# Patient Record
Sex: Female | Born: 1995 | Race: White | Hispanic: No | Marital: Single | Smoking: Never smoker
Health system: Southern US, Community
[De-identification: ages and names within clinical notes are randomized; demographics above are authoritative.]

---

## 2016-01-28 ENCOUNTER — Emergency Department (HOSPITAL_COMMUNITY): Payer: BLUE CROSS/BLUE SHIELD

## 2016-01-28 ENCOUNTER — Emergency Department (HOSPITAL_COMMUNITY)
Admission: EM | Admit: 2016-01-28 | Discharge: 2016-01-28 | Disposition: A | Discharge status: Home / Self Care | Payer: BLUE CROSS/BLUE SHIELD | Attending: Emergency Medicine | Admitting: Emergency Medicine

## 2016-01-28 ENCOUNTER — Encounter (HOSPITAL_COMMUNITY): Payer: Self-pay | Admitting: Emergency Medicine

## 2016-01-28 DIAGNOSIS — Y9289 Other specified places as the place of occurrence of the external cause: Secondary | ICD-10-CM | POA: Insufficient documentation

## 2016-01-28 DIAGNOSIS — S8991XA Unspecified injury of right lower leg, initial encounter: Secondary | ICD-10-CM | POA: Insufficient documentation

## 2016-01-28 DIAGNOSIS — Y998 Other external cause status: Secondary | ICD-10-CM | POA: Insufficient documentation

## 2016-01-28 DIAGNOSIS — W08XXXA Fall from other furniture, initial encounter: Secondary | ICD-10-CM | POA: Diagnosis not present

## 2016-01-28 DIAGNOSIS — M25561 Pain in right knee: Secondary | ICD-10-CM

## 2016-01-28 DIAGNOSIS — Y9389 Activity, other specified: Secondary | ICD-10-CM | POA: Insufficient documentation

## 2016-01-28 MED ORDER — NAPROXEN 250 MG PO TABS: 250.0000 mg | ORAL_TABLET | Freq: Two times a day (BID) | ORAL | Status: AC

## 2016-01-28 MED ORDER — ACETAMINOPHEN 325 MG PO TABS
650.0000 mg | ORAL_TABLET | Freq: Once | ORAL | Status: AC
  Administered 2016-01-28: 650 mg via ORAL
  Filled 2016-01-28: qty 2

## 2016-01-28 NOTE — ED Notes (Signed)
Pt fell off the couch as her "leg gave out and I heard a pop".  Right knee began to swell and it feels, "locked now".

## 2016-01-28 NOTE — ED Notes (Signed)
Bed: WA27 Expected date:  Expected time:  Means of arrival:  Comments: 

## 2016-01-28 NOTE — ED Provider Notes (Signed)
CSN: [id number]     Arrival date & time 01/28/16  1138 History  By signing my name below, I, Soijett Blue, attest that this documentation has been prepared under the direction and in the presence of Will Zakira Ressel, PA-C Electronically Signed: Soijett Blue, ED Scribe. 01/28/2016. 12:19 PM.   Chief Complaint  Patient presents with  . Knee Injury      The history is provided by the patient. No language interpreter was used.    Molly Arias is a 20 y.o. female who presents to the Emergency Department via EMS complaining of right knee injury occurring last night. Pt notes that she rotated off of a couch and she heard a "snap" with progressive swelling to the right knee since. Pt rates her right knee pain as 8/10 at this time. Denies prior injury to the area. Pt is having associated symptoms of gait problem due to pain and right knee swelling. She notes that she has tried advil with her last dose at 10 AM, rest, ice, with no relief of her symptoms. She denies color change, wound, rash, numbness, tingling, weakness, and any other symptoms.    History reviewed. No pertinent past medical history. History reviewed. No pertinent past surgical history. History reviewed. No pertinent family history. Social History  Substance Use Topics  . Smoking status: Never Smoker   . Smokeless tobacco: None  . Alcohol Use: None   OB History    No data available     Review of Systems  Constitutional: Negative for fever.  Cardiovascular: Negative for leg swelling.  Musculoskeletal: Positive for joint swelling, arthralgias and gait problem (due to pain).  Skin: Negative for color change, rash and wound.  Neurological: Negative for weakness and numbness.       No tingling      Allergies  Review of patient's allergies indicates no known allergies.  Home Medications   Prior to Admission medications   Medication Sig Start Date End Date Taking? Authorizing Provider  naproxen (NAPROSYN) 250 MG tablet  Take 1 tablet (250 mg total) by mouth 2 (two) times daily with a meal. 01/28/16   Everlene FarrierWilliam Kesa Birky, PA-C   BP 148/101 mmHg  Pulse 105  Temp(Src) 98.3 F (36.8 C) (Oral)  Resp 16  SpO2 100%  LMP 01/21/2016 Physical Exam  Constitutional: She appears well-developed and well-nourished. No distress.  Nontoxic appearing.  HENT:  Head: Normocephalic and atraumatic.  Eyes: Right eye exhibits no discharge. Left eye exhibits no discharge.  Cardiovascular: Normal rate, regular rhythm and intact distal pulses.   Bilateral posterior tibialis and dorsalis pedis pulses are intact.  Good capillary refill to her right distal toes.  Pulmonary/Chest: Effort normal. No respiratory distress.  Musculoskeletal: Normal range of motion. She exhibits edema and tenderness.  Right knee: Bilateral dp and pt pulses intact. No calf tenderness. Medial edema and tenderness to the medial aspect. Good right knee ROM. Able to ambulate with antalgic gait. No knee instability. No ecchymosis. No deformity.   Neurological: She is alert. Coordination normal.  Sensation is intact in her bilateral lower extremities.  Skin: Skin is warm and dry. No rash noted. She is not diaphoretic. No erythema. No pallor.  Psychiatric: She has a normal mood and affect. Her behavior is normal.  Nursing note and vitals reviewed.   ED Course  Procedures (including critical care time) DIAGNOSTIC STUDIES: Oxygen Saturation is 100% on RA, nl by my interpretation.    COORDINATION OF CARE: 12:18 PM Discussed treatment plan with pt  at bedside which includes right knee xray, tylenol, and ice and pt agreed to plan.    Labs Review Labs Reviewed - No data to display  Imaging Review Dg Knee Complete 4 Views Right  01/28/2016  CLINICAL DATA:  Injury EXAM: RIGHT KNEE - COMPLETE 4+ VIEW COMPARISON:  None. FINDINGS: Four views of the right knee submitted. No displaced fracture or subluxation. There is a tiny linear calcification adjacent to medial  femoral condyle. Previous injury of medial collateral ligament cannot be excluded. Clinical correlation is necessary. Further correlation with MRI could be performed as clinically warranted. Small joint effusion. IMPRESSION: No displaced fracture or subluxation. There is a tiny linear calcification adjacent to medial femoral condyle. Previous injury of medial collateral ligament cannot be excluded. Clinical correlation is necessary. Further correlation with MRI could be performed as clinically warranted. Small joint effusion. Electronically Signed   By: Natasha Mead M.D.   On: 01/28/2016 13:21   I have personally reviewed and evaluated these images as part of my medical decision-making.   EKG Interpretation None     Filed Vitals:   01/28/16 1144  BP: 148/101  Pulse: 105  Temp: 98.3 F (36.8 C)  TempSrc: Oral  Resp: 16  SpO2: 100%    MDM   Meds given in ED:  Medications  acetaminophen (TYLENOL) tablet 650 mg (650 mg Oral Given 01/28/16 1216)    New Prescriptions   NAPROXEN (NAPROSYN) 250 MG TABLET    Take 1 tablet (250 mg total) by mouth 2 (two) times daily with a meal.    Final diagnoses:  Right knee pain   This is a 20 y.o. female who presents to the Emergency Department via EMS complaining of right knee injury occurring last night. Pt notes that she rotated off of a couch and she heard a "snap" with progressive swelling to the right knee since. On exam the patient is afebrile and nontoxic appearing. She does have some mild medial right knee edema with tenderness to palpation. No knee instability. No deformity. No ecchymosis or erythema. Good range of motion of her right knee. She is ambulating with antalgic gait to the room. Right knee x-ray shows no displaced fracture or subluxation. It does indicate a tiny linear calcification adjacent to the medial femoral condyle which could be an indication of a previous injury to the MCL. I discussed these findings with the patient. Will provide  with knee sleeve and crutches and have her nonweightbearing until she can follow-up with orthopedic surgeon Dr. Roda Shutters. I advised she may require further imaging such as MRI after evaluation by orthopedic surgery. I advised the patient to follow-up with their primary care provider this week. I advised the patient to return to the emergency department with new or worsening symptoms or new concerns. The patient verbalized understanding and agreement with plan.    I personally performed the services described in this documentation, which was scribed in my presence. The recorded information has been reviewed and is accurate.       Everlene Farrier, PA-C 01/28/16 1338  Lyndal Pulley, MD 01/30/16 [phone]

## 2016-01-28 NOTE — Discharge Instructions (Signed)
°How to Use a Knee Brace °A knee brace is a device that you wear to support your knee, especially if the knee is healing after an injury or surgery. There are several types of knee braces. Some are designed to prevent an injury (prophylactic brace). These are often worn during sports. Others support an injured knee (functional brace) or keep it still while it heals (rehabilitative brace). People with severe arthritis of the knee may benefit from a brace that takes some pressure off the knee (unloader brace). Most knee braces are made from a combination of cloth and metal or plastic.  °You may need to wear a knee brace to: °· Relieve knee pain. °· Help your knee support your weight (improve stability). °· Help you walk farther (improve mobility). °· Prevent injury. °· Support your knee while it heals from surgery or from an injury. °RISKS AND COMPLICATIONS °Generally, knee braces are very safe to wear. However, problems may occur, including: °1. Skin irritation that may lead to infection. °2. Making your condition worse if you wear the brace in the wrong way. °HOW TO USE A KNEE BRACE °Different braces will have different instructions for use. Your health care provider will tell you or show you: °1. How to put on your brace. °2. How to adjust the brace. °3. When and how often to wear the brace. °4. How to remove the brace. °5. If you will need any assistive devices in addition to the brace, such as crutches or a cane. °In general, your brace should: °1. Have the hinge of the brace line up with the bend of your knee. °2. Have straps, hooks, or tapes that fasten snugly around your leg. °3. Not feel too tight or too loose.  °HOW TO CARE FOR A KNEE BRACE °1. Check your brace often for signs of damage, such as loose connections or attachments. Your knee brace may get damaged or wear out during normal use. °2. Wash the fabric parts of your brace with soap and water. °3. Read the insert that comes with your brace for other  specific care instructions.  °SEEK MEDICAL CARE IF: °1. Your knee brace is too loose or too tight and you cannot adjust it. °2. Your knee brace causes skin redness, swelling, bruising, or irritation. °3. Your knee brace is not helping. °4. Your knee brace is making your knee pain worse. °  °This information is not intended to replace advice given to you by your health care provider. Make sure you discuss any questions you have with your health care provider. °  °Document Released: 01/11/2004 Document Revised: 07/12/2015 Document Reviewed: 02/13/2015 °Elsevier Interactive Patient Education ©2016 [employer] °Knee Pain °Knee pain is a very common symptom and can have many causes. Knee pain often goes away when you follow your health care provider's instructions for relieving pain and discomfort at home. However, knee pain can develop into a condition that needs treatment. Some conditions may include: °· Arthritis caused by wear and tear (osteoarthritis). °· Arthritis caused by swelling and irritation (rheumatoid arthritis or gout). °· A cyst or growth in your knee. °· An infection in your knee joint. °· An injury that will not heal. °· Damage, swelling, or irritation of the tissues that support your knee (torn ligaments or tendinitis). °If your knee pain continues, additional tests may be ordered to diagnose your condition. Tests may include X-rays or other imaging studies of your knee. You may also need to have fluid removed from your knee. Treatment for ongoing   knee pain depends on the cause, but treatment may include: 3. Medicines to relieve pain or swelling. 4. Steroid injections in your knee. 5. Physical therapy. 6. Surgery. HOME CARE INSTRUCTIONS 6. Take medicines only as directed by your health care provider. 7. Rest your knee and keep it raised (elevated) while you are resting. 8. Do not do things that cause or worsen pain. 9. Avoid high-impact activities or exercises, such as running, jumping rope,  or doing jumping jacks. 10. Apply ice to the knee area: 1. Put ice in a plastic bag. 2. Place a towel between your skin and the bag. 3. Leave the ice on for 20 minutes, 2-3 times a day. 11. Ask your health care provider if you should wear an elastic knee support. 12. Keep a pillow under your knee when you sleep. 13. Lose weight if you are overweight. Extra weight can put pressure on your knee. 14. Do not use any tobacco products, including cigarettes, chewing tobacco, or electronic cigarettes. If you need help quitting, ask your health care provider. Smoking may slow the healing of any bone and joint problems that you may have. SEEK MEDICAL CARE IF: 4. Your knee pain continues, changes, or gets worse. 5. You have a fever along with knee pain. 6. Your knee buckles or locks up. 7. Your knee becomes more swollen. SEEK IMMEDIATE MEDICAL CARE IF:  4. Your knee joint feels hot to the touch. 5. You have chest pain or trouble breathing.   This information is not intended to replace advice given to you by your health care provider. Make sure you discuss any questions you have with your health care provider.   Document Released: 08/18/2007 Document Revised: 11/11/2014 Document Reviewed: 06/06/2014 Elsevier Interactive Patient Education 2016 [employer] Crutch Use Crutches are used to take weight off one of your legs or feet when you stand or walk. It is important to use crutches that fit properly. When fitted properly:  Each crutch should be 2-3 finger widths below the armpit.  Your weight should be supported by your hand, and not by resting the armpit on the crutch. RISKS AND COMPLICATIONS Damage to the nerves that extend from your armpit to your hand and arm. To prevent this from happening, make sure your crutches fit properly and do not put pressure on your armpit when using them. HOW TO USE YOUR CRUTCHES If you have been instructed to use partial weight bearing, apply (bear) the amount of  weight as your health care provider suggests. Do not bear weight in an amount that causes pain to the area of injury. Walking 7. Step with the crutches. 8. Swing the healthy leg slightly ahead of the crutches. Going Up Steps If there is no handrail: 15. Step up with the healthy leg. 16. Step up with the crutches and injured leg. 17. Continue in this way. If there is a handrail: 8. Hold both crutches in one hand. 9. Place your free hand on the handrail. 10. While putting your weight on your arms, lift your healthy leg to the step. 11. Bring the crutches and the injured leg up to that step. 12. Continue in this way. Going Down Steps Be very careful, as going down stairs with crutches is very challenging. If there is no handrail: 6. Step down with the injured leg and crutches. 7. Step down with the healthy leg. If there is a handrail: 5. Place your hand on the handrail. 6. Hold both crutches with your free hand. 7. Lower  your injured leg and crutch to the step below you. Make sure to keep the crutch tips in the center of the step, never on the edge. 8. Lower your healthy leg to that step. 9. Continue in this way. Standing Up 1. Hold the injured leg forward. 2. Grab the armrest with one hand and the top of the crutches with the other hand. 3. Using these supports, pull yourself up to a standing position. Sitting Down 1. Hold the injured leg forward. 2. Grab the armrest with one hand and the top of the crutches with the other hand. 3. Lower yourself to a sitting position. SEEK MEDICAL CARE IF:  You still feel unsteady on your feet.  You develop new pain, for example in your armpits, back, shoulder, wrist, or hip.  You develop any numbness or tingling. SEEK IMMEDIATE MEDICAL CARE IF:  You fall.   This information is not intended to replace advice given to you by your health care provider. Make sure you discuss any questions you have with your health care provider.   Document  Released: 10/18/2000 Document Revised: 11/11/2014 Document Reviewed: 06/28/2013 Elsevier Interactive Patient Education [employer]

## 2017-09-11 IMAGING — DX DG KNEE COMPLETE 4+V*R*
4 series · 4 of 4 positions shown · non-contrast
Comparison: None.

CLINICAL DATA: Injury

EXAM:
RIGHT KNEE - COMPLETE 4+ VIEW

[knee ap]
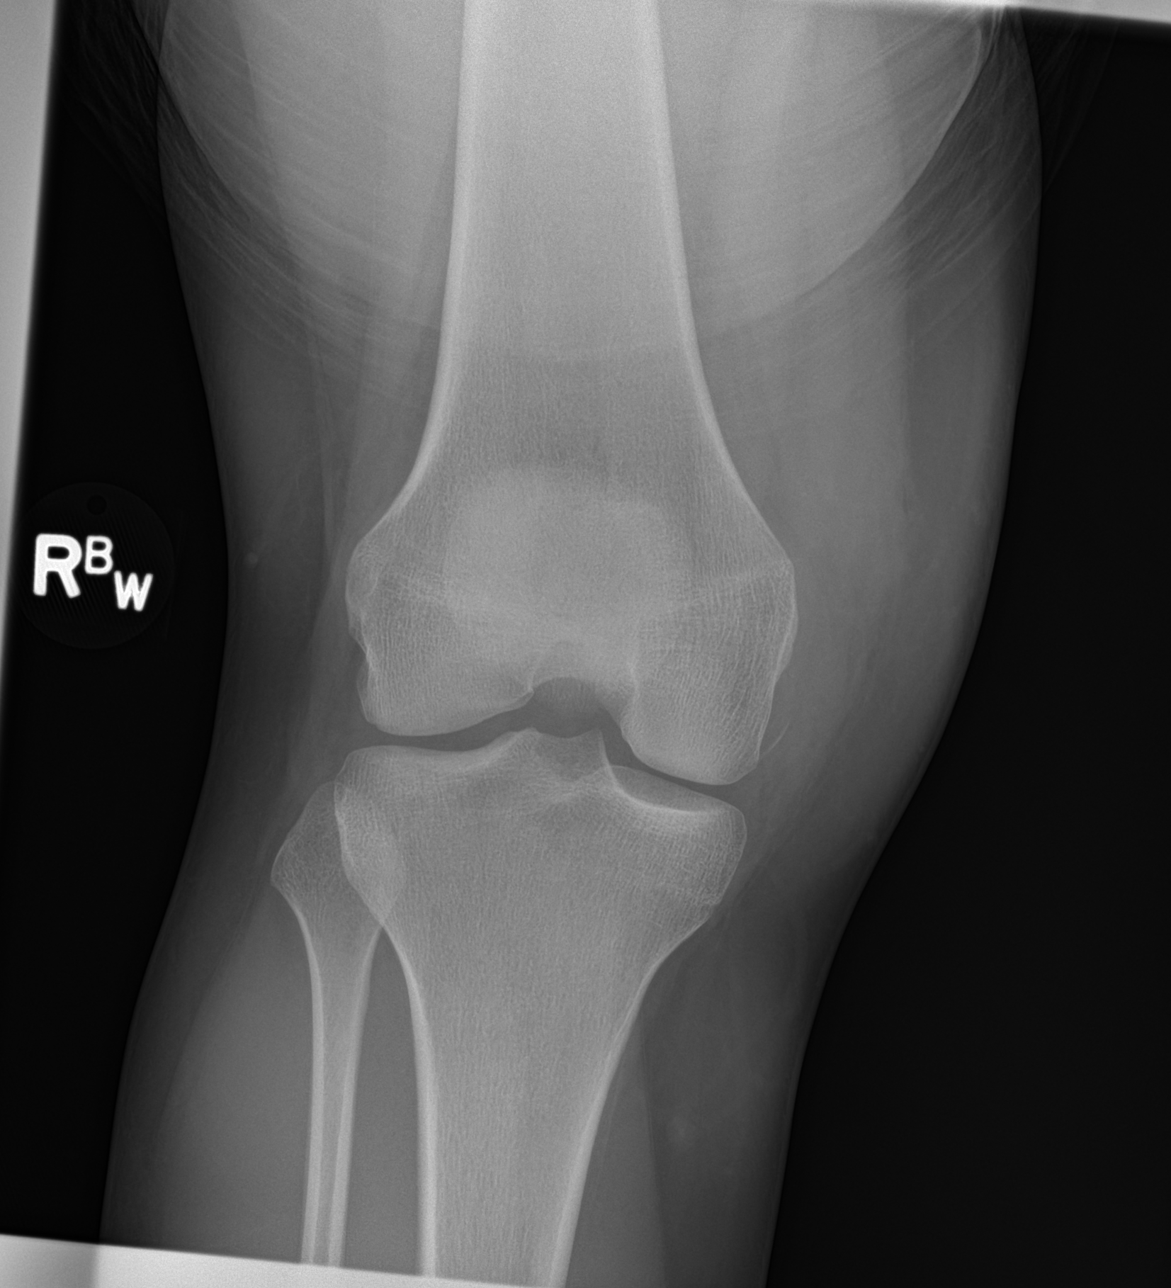

[knee lat]
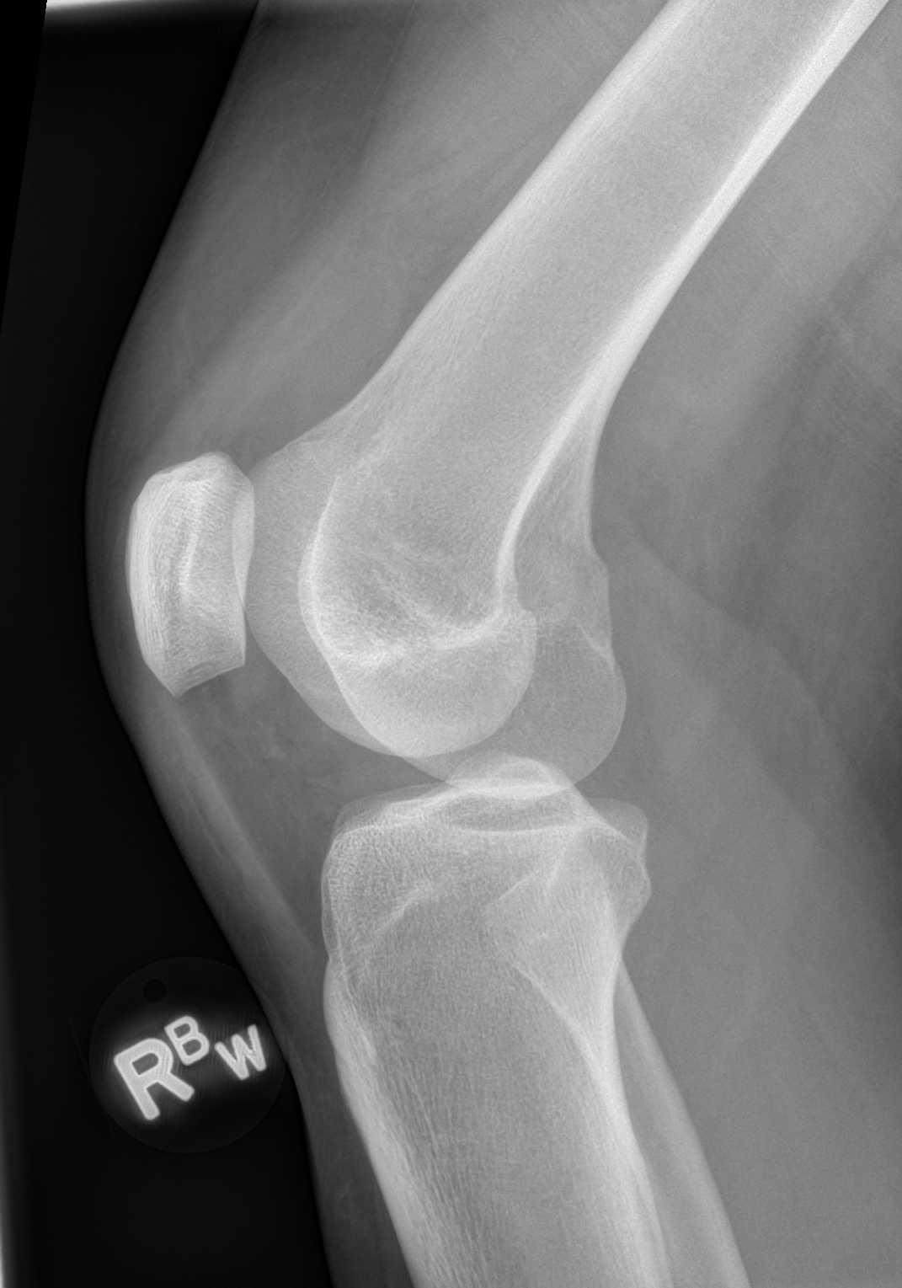

[knee obl (1 of 2)]
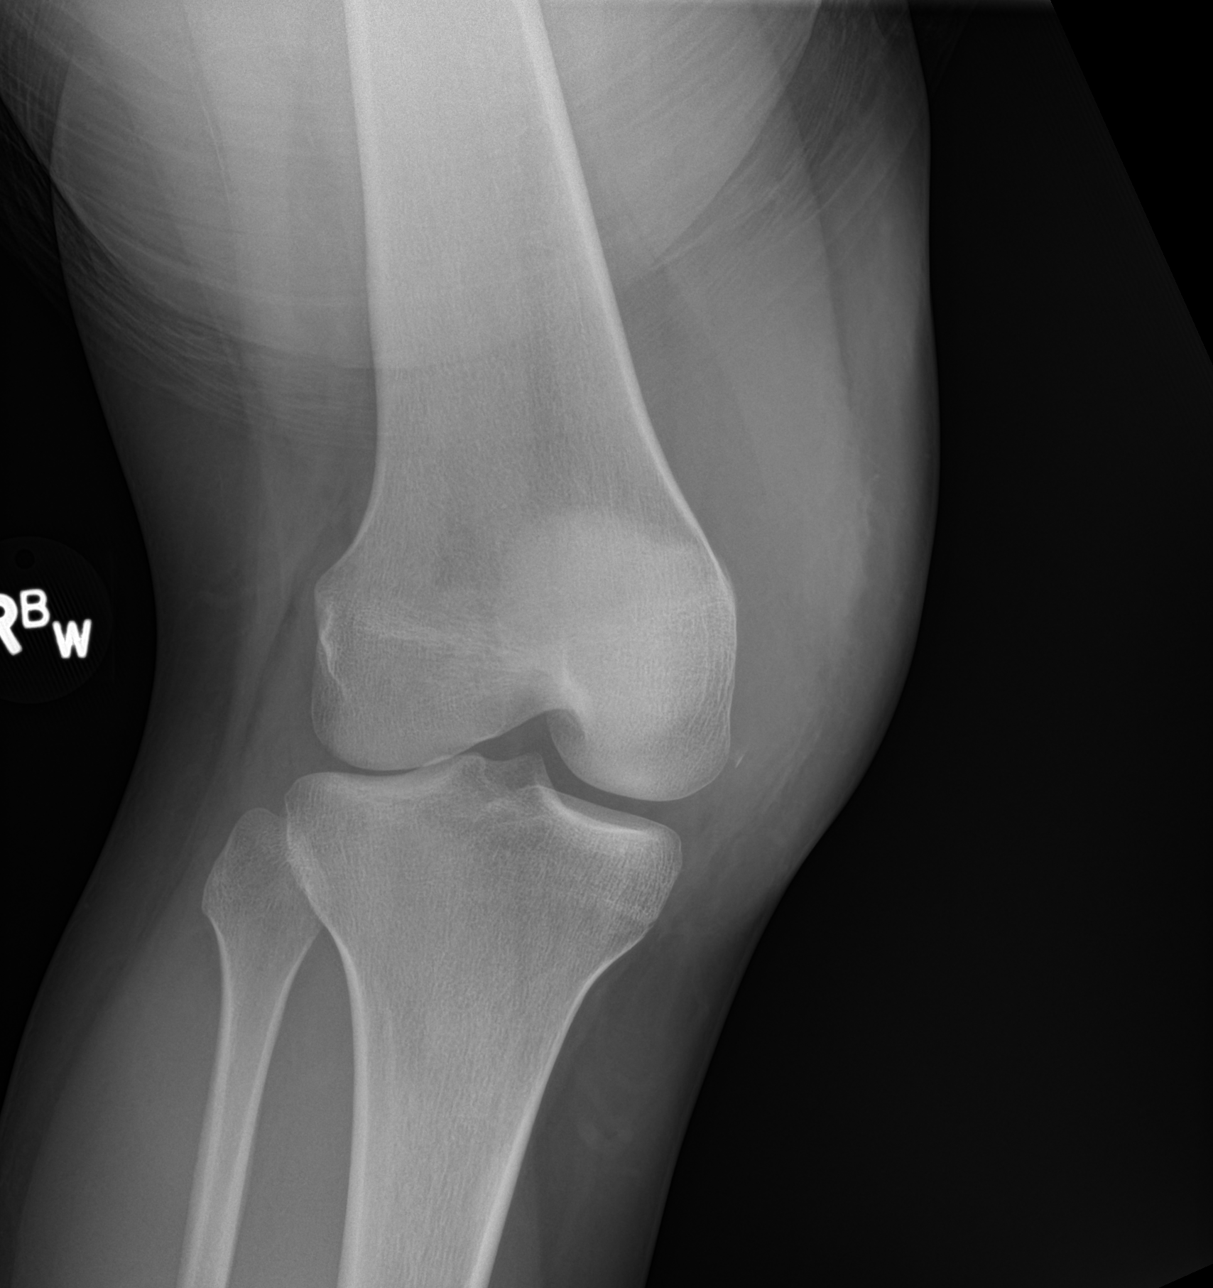

[knee obl (2 of 2)]
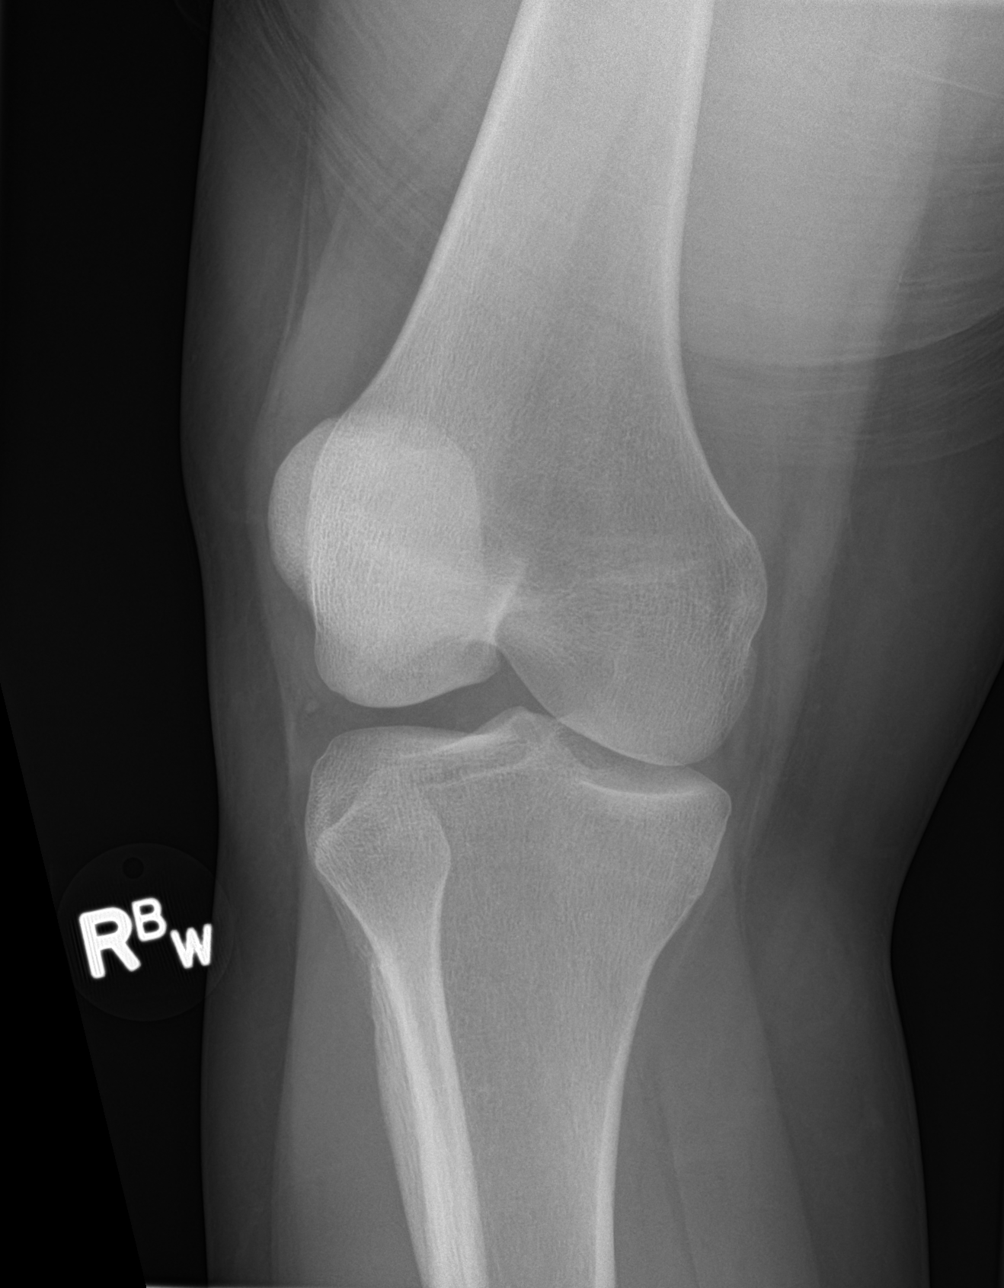

[4 of 4 positions shown; findings below may reference images not displayed]

FINDINGS: Four views of the right knee submitted. No displaced fracture or
subluxation. There is a tiny linear calcification adjacent to medial
femoral condyle. Previous injury of medial collateral ligament
cannot be excluded. Clinical correlation is necessary. Further
correlation with MRI could be performed as clinically warranted.
Small joint effusion.
IMPRESSION: No displaced fracture or subluxation. There is a tiny linear
calcification adjacent to medial femoral condyle. Previous injury of
medial collateral ligament cannot be excluded. Clinical correlation
is necessary. Further correlation with MRI could be performed as
clinically warranted. Small joint effusion.
